# Patient Record
Sex: Female | Born: 1975 | Race: Black or African American | Hispanic: No | Marital: Married
Health system: Southern US, Community
[De-identification: ages and names within clinical notes are randomized; demographics above are authoritative.]

## PROBLEM LIST (undated history)

## (undated) DIAGNOSIS — I10 Essential (primary) hypertension: Secondary | ICD-10-CM

## (undated) DIAGNOSIS — F1910 Other psychoactive substance abuse, uncomplicated: Secondary | ICD-10-CM

## (undated) HISTORY — PX: NO PAST SURGERIES: SHX2092

---

## 2000-01-19 ENCOUNTER — Emergency Department (HOSPITAL_COMMUNITY): Admission: EM | Admit: 2000-01-19 | Discharge: 2000-01-19 | Payer: Self-pay | Admitting: Emergency Medicine

## 2002-07-27 ENCOUNTER — Ambulatory Visit (HOSPITAL_COMMUNITY): Admission: RE | Admit: 2002-07-27 | Discharge: 2002-07-27 | Payer: Self-pay | Admitting: *Deleted

## 2002-11-20 ENCOUNTER — Ambulatory Visit (HOSPITAL_COMMUNITY): Admission: RE | Admit: 2002-11-20 | Discharge: 2002-11-20 | Payer: Self-pay | Admitting: *Deleted

## 2002-11-24 ENCOUNTER — Inpatient Hospital Stay (HOSPITAL_COMMUNITY): Admission: AD | Admit: 2002-11-24 | Discharge: 2002-11-25 | Payer: Self-pay | Admitting: *Deleted

## 2002-12-19 ENCOUNTER — Inpatient Hospital Stay (HOSPITAL_COMMUNITY): Admission: AD | Admit: 2002-12-19 | Discharge: 2002-12-22 | Payer: Self-pay | Admitting: *Deleted

## 2003-12-11 ENCOUNTER — Ambulatory Visit (HOSPITAL_COMMUNITY): Admission: RE | Admit: 2003-12-11 | Discharge: 2003-12-11 | Payer: Self-pay | Admitting: *Deleted

## 2004-02-26 ENCOUNTER — Inpatient Hospital Stay (HOSPITAL_COMMUNITY): Admission: AD | Admit: 2004-02-26 | Discharge: 2004-02-28 | Payer: Self-pay | Admitting: Obstetrics and Gynecology

## 2004-02-26 ENCOUNTER — Ambulatory Visit: Payer: Self-pay | Admitting: Obstetrics and Gynecology

## 2006-01-18 ENCOUNTER — Ambulatory Visit (HOSPITAL_COMMUNITY): Admission: RE | Admit: 2006-01-18 | Discharge: 2006-01-18 | Payer: Self-pay | Admitting: Obstetrics

## 2006-03-22 ENCOUNTER — Inpatient Hospital Stay (HOSPITAL_COMMUNITY): Admission: AD | Admit: 2006-03-22 | Discharge: 2006-03-25 | Payer: Self-pay | Admitting: Obstetrics

## 2009-10-17 ENCOUNTER — Inpatient Hospital Stay (HOSPITAL_COMMUNITY): Admission: AD | Admit: 2009-10-17 | Discharge: 2009-10-17 | Payer: Self-pay | Admitting: Obstetrics & Gynecology

## 2009-10-17 ENCOUNTER — Ambulatory Visit: Payer: Self-pay | Admitting: Physician Assistant

## 2010-03-01 NOTE — L&D Delivery Note (Addendum)
   Delivery Note At 11:38 PM a viable female was delivered via  (Presentation: ;  ).  APGAR: 8, 9; weight 7 lb 15 oz (3600 g).   Placenta status: Intact, Manual removal.  Cord: 3 vessels with the following complications: None.  Anesthesia: Epidural  Episiotomy: None Lacerations: None Suture Repair: no repair Est. Blood Loss (mL):   Mom to postpartum.  Baby to nursery-stable.  Rhian Funari 09/09/2010, 12:30 AM     Requested by Dr. Penne Lash to attend this vaginal delivery for thick MSAF.  Born to a 79 y/o G5P4 mother with PNC O+Ab- and negative screens.  Maternal history of (+) smoker and  cocaine use.   SROM >24 hours PTD with thick MSAF.  Infant less than a minute old when team arrived and crying vigorously under radiant warmer.  Dried, bulb suctioned and kept warm.  APGAR 8 and 9.  Per RN, infant is up for adoption.   Perlie Gold, MD Neonatologist

## 2010-05-14 LAB — GC/CHLAMYDIA PROBE AMP, GENITAL
Chlamydia, DNA Probe: POSITIVE — AB
GC Probe Amp, Genital: NEGATIVE

## 2010-05-14 LAB — RAPID URINE DRUG SCREEN, HOSP PERFORMED
Amphetamines: NOT DETECTED
Barbiturates: NOT DETECTED
Benzodiazepines: NOT DETECTED
Cocaine: NOT DETECTED
Opiates: NOT DETECTED
Tetrahydrocannabinol: NOT DETECTED

## 2010-05-14 LAB — URINE MICROSCOPIC-ADD ON

## 2010-05-14 LAB — CBC
HCT: 38.2 % (ref 36.0–46.0)
Hemoglobin: 12.1 g/dL (ref 12.0–15.0)
MCH: 29.6 pg (ref 26.0–34.0)
MCHC: 31.6 g/dL (ref 30.0–36.0)
MCV: 93.6 fL (ref 78.0–100.0)
Platelets: 248 10*3/uL (ref 150–400)
RBC: 4.08 MIL/uL (ref 3.87–5.11)
RDW: 13.8 % (ref 11.5–15.5)
WBC: 6.4 10*3/uL (ref 4.0–10.5)

## 2010-05-14 LAB — URINALYSIS, ROUTINE W REFLEX MICROSCOPIC
Bilirubin Urine: NEGATIVE
Glucose, UA: NEGATIVE mg/dL
Ketones, ur: NEGATIVE mg/dL
Leukocytes, UA: NEGATIVE
Nitrite: NEGATIVE
Protein, ur: NEGATIVE mg/dL
Specific Gravity, Urine: 1.01 (ref 1.005–1.030)
Urobilinogen, UA: 0.2 mg/dL (ref 0.0–1.0)
pH: 7 (ref 5.0–8.0)

## 2010-05-14 LAB — HCG, QUANTITATIVE, PREGNANCY: hCG, Beta Chain, Quant, S: 2436 m[IU]/mL — ABNORMAL HIGH (ref <5)

## 2010-05-14 LAB — WET PREP, GENITAL
Trich, Wet Prep: NONE SEEN
Yeast Wet Prep HPF POC: NONE SEEN

## 2010-05-14 LAB — ABO/RH: ABO/RH(D): A POS

## 2010-05-14 LAB — POCT PREGNANCY, URINE: Preg Test, Ur: POSITIVE

## 2010-07-08 ENCOUNTER — Inpatient Hospital Stay (HOSPITAL_COMMUNITY): Payer: Medicaid Other

## 2010-07-08 ENCOUNTER — Inpatient Hospital Stay (HOSPITAL_COMMUNITY)
Admission: AD | Admit: 2010-07-08 | Discharge: 2010-07-08 | Disposition: A | Discharge status: Home / Self Care | Payer: Medicaid Other | Source: Ambulatory Visit | Attending: Obstetrics and Gynecology | Admitting: Obstetrics and Gynecology

## 2010-07-08 DIAGNOSIS — O139 Gestational [pregnancy-induced] hypertension without significant proteinuria, unspecified trimester: Secondary | ICD-10-CM

## 2010-07-08 LAB — CBC
HCT: 32.5 % — ABNORMAL LOW (ref 36.0–46.0)
Hemoglobin: 10.6 g/dL — ABNORMAL LOW (ref 12.0–15.0)
MCH: 30.3 pg (ref 26.0–34.0)
MCHC: 32.6 g/dL (ref 30.0–36.0)
MCV: 92.9 fL (ref 78.0–100.0)
Platelets: 220 10*3/uL (ref 150–400)
RBC: 3.5 MIL/uL — ABNORMAL LOW (ref 3.87–5.11)
RDW: 13.1 % (ref 11.5–15.5)
WBC: 8.2 10*3/uL (ref 4.0–10.5)

## 2010-07-08 LAB — DIFFERENTIAL
Basophils Absolute: 0 10*3/uL (ref 0.0–0.1)
Basophils Relative: 0 % (ref 0–1)
Eosinophils Absolute: 0 10*3/uL (ref 0.0–0.7)
Eosinophils Relative: 0 % (ref 0–5)
Lymphocytes Relative: 20 % (ref 12–46)
Lymphs Abs: 1.6 10*3/uL (ref 0.7–4.0)
Monocytes Absolute: 0.4 10*3/uL (ref 0.1–1.0)
Monocytes Relative: 5 % (ref 3–12)
Neutro Abs: 6.1 10*3/uL (ref 1.7–7.7)
Neutrophils Relative %: 75 % (ref 43–77)

## 2010-07-08 LAB — COMPREHENSIVE METABOLIC PANEL
ALT: 6 U/L (ref 0–35)
AST: 9 U/L (ref 0–37)
Albumin: 2.1 g/dL — ABNORMAL LOW (ref 3.5–5.2)
Alkaline Phosphatase: 59 U/L (ref 39–117)
BUN: 6 mg/dL (ref 6–23)
CO2: 21 mEq/L (ref 19–32)
Calcium: 8.3 mg/dL — ABNORMAL LOW (ref 8.4–10.5)
Chloride: 103 mEq/L (ref 96–112)
Creatinine, Ser: 0.53 mg/dL (ref 0.4–1.2)
GFR calc Af Amer: >60 mL/min (ref >60)
GFR calc non Af Amer: >60 mL/min (ref >60)
Glucose, Bld: 83 mg/dL (ref 70–99)
Potassium: 3.4 mEq/L — ABNORMAL LOW (ref 3.5–5.1)
Sodium: 132 mEq/L — ABNORMAL LOW (ref 135–145)
Total Bilirubin: 0.1 mg/dL — ABNORMAL LOW (ref 0.3–1.2)
Total Protein: 6 g/dL (ref 6.0–8.3)

## 2010-07-08 LAB — URINALYSIS, ROUTINE W REFLEX MICROSCOPIC
Bilirubin Urine: NEGATIVE
Glucose, UA: NEGATIVE mg/dL
Hgb urine dipstick: NEGATIVE
Ketones, ur: NEGATIVE mg/dL
Nitrite: NEGATIVE
Protein, ur: NEGATIVE mg/dL
Specific Gravity, Urine: 1.025 (ref 1.005–1.030)
Urobilinogen, UA: 0.2 mg/dL (ref 0.0–1.0)
pH: 6 (ref 5.0–8.0)

## 2010-07-08 LAB — TYPE AND SCREEN
ABO/RH(D): A POS
Antibody Screen: NEGATIVE

## 2010-07-08 LAB — POCT PREGNANCY, URINE: Preg Test, Ur: POSITIVE

## 2010-07-08 LAB — RAPID URINE DRUG SCREEN, HOSP PERFORMED
Amphetamines: NOT DETECTED
Barbiturates: NOT DETECTED
Benzodiazepines: NOT DETECTED
Cocaine: POSITIVE — AB
Opiates: NOT DETECTED
Tetrahydrocannabinol: NOT DETECTED

## 2010-07-08 LAB — URINE MICROSCOPIC-ADD ON

## 2010-07-08 LAB — WET PREP, GENITAL
Clue Cells Wet Prep HPF POC: NONE SEEN
Trich, Wet Prep: NONE SEEN
Yeast Wet Prep HPF POC: NONE SEEN

## 2010-07-08 LAB — SICKLE CELL SCREEN: Sickle Cell Screen: NEGATIVE

## 2010-07-08 LAB — RPR: RPR Ser Ql: NONREACTIVE

## 2010-07-09 LAB — GC/CHLAMYDIA PROBE AMP, GENITAL: Chlamydia, DNA Probe: POSITIVE — AB

## 2010-07-09 LAB — HIV ANTIBODY (ROUTINE TESTING W REFLEX): HIV: NONREACTIVE

## 2010-07-09 LAB — HEPATITIS B SURFACE ANTIGEN: Hepatitis B Surface Ag: NEGATIVE

## 2010-09-08 ENCOUNTER — Encounter (HOSPITAL_COMMUNITY): Payer: Self-pay | Admitting: *Deleted

## 2010-09-08 ENCOUNTER — Inpatient Hospital Stay (HOSPITAL_COMMUNITY)
Admission: AD | Admit: 2010-09-08 | Discharge: 2010-09-10 | DRG: 774 | Disposition: A | Discharge status: Home / Self Care | Payer: Medicaid Other | Source: Ambulatory Visit | Attending: Obstetrics & Gynecology | Admitting: Obstetrics & Gynecology

## 2010-09-08 ENCOUNTER — Inpatient Hospital Stay (HOSPITAL_COMMUNITY): Payer: Medicaid Other | Admitting: Anesthesiology

## 2010-09-08 ENCOUNTER — Encounter (HOSPITAL_COMMUNITY): Payer: Self-pay | Admitting: Anesthesiology

## 2010-09-08 DIAGNOSIS — IMO0001 Reserved for inherently not codable concepts without codable children: Secondary | ICD-10-CM

## 2010-09-08 DIAGNOSIS — O99344 Other mental disorders complicating childbirth: Secondary | ICD-10-CM

## 2010-09-08 DIAGNOSIS — F1410 Cocaine abuse, uncomplicated: Secondary | ICD-10-CM | POA: Diagnosis present

## 2010-09-08 HISTORY — DX: Essential (primary) hypertension: I10

## 2010-09-08 LAB — RAPID URINE DRUG SCREEN, HOSP PERFORMED
Amphetamines: NOT DETECTED
Opiates: NOT DETECTED
Tetrahydrocannabinol: NOT DETECTED

## 2010-09-08 LAB — CBC
MCH: 27.9 pg (ref 26.0–34.0)
MCV: 85.2 fL (ref 78.0–100.0)
Platelets: 229 10*3/uL (ref 150–400)
RDW: 14.8 % (ref 11.5–15.5)

## 2010-09-08 MED ORDER — DIPHENHYDRAMINE HCL 50 MG/ML IJ SOLN: 12.5000 mg | INTRAMUSCULAR | Status: DC | PRN

## 2010-09-08 MED ORDER — OXYTOCIN 20 UNITS IN LACTATED RINGERS INFUSION - SIMPLE
125.0000 mL/h | Freq: Once | INTRAVENOUS | Status: AC
  Administered 2010-09-08: 500 mL/h via INTRAVENOUS
  Filled 2010-09-08: qty 1000

## 2010-09-08 MED ORDER — ONDANSETRON HCL 4 MG/2ML IJ SOLN: 4.0000 mg | Freq: Four times a day (QID) | INTRAMUSCULAR | Status: DC | PRN

## 2010-09-08 MED ORDER — BUPIVACAINE HCL (PF) 0.25 % IJ SOLN
INTRAMUSCULAR | Status: DC | PRN
  Administered 2010-09-08: 10 mg

## 2010-09-08 MED ORDER — LACTATED RINGERS IV SOLN
500.0000 mL | Freq: Once | INTRAVENOUS | Status: AC
  Administered 2010-09-08: 1000 mL via INTRAVENOUS

## 2010-09-08 MED ORDER — LACTATED RINGERS IV SOLN: 500.0000 mL | INTRAVENOUS | Status: DC | PRN

## 2010-09-08 MED ORDER — FLEET ENEMA 7-19 GM/118ML RE ENEM: 1.0000 | ENEMA | RECTAL | Status: DC | PRN

## 2010-09-08 MED ORDER — ACETAMINOPHEN 325 MG PO TABS: 650.0000 mg | ORAL_TABLET | ORAL | Status: DC | PRN

## 2010-09-08 MED ORDER — LIDOCAINE HCL (PF) 2 % IJ SOLN
INTRAMUSCULAR | Status: DC | PRN
  Administered 2010-09-08 (×2): 40 mg
  Administered 2010-09-08: 80 mg
  Administered 2010-09-08: 40 mg

## 2010-09-08 MED ORDER — IBUPROFEN 600 MG PO TABS: 600.0000 mg | ORAL_TABLET | Freq: Four times a day (QID) | ORAL | Status: DC | PRN

## 2010-09-08 MED ORDER — EPHEDRINE 5 MG/ML INJ: 10.0000 mg | INTRAVENOUS | Status: DC | PRN

## 2010-09-08 MED ORDER — ERYTHROMYCIN 5 MG/GM OP OINT
TOPICAL_OINTMENT | OPHTHALMIC | Status: AC
  Filled 2010-09-08: qty 1

## 2010-09-08 MED ORDER — CITRIC ACID-SODIUM CITRATE 334-500 MG/5ML PO SOLN: 30.0000 mL | ORAL | Status: DC | PRN

## 2010-09-08 MED ORDER — LACTATED RINGERS IV SOLN
INTRAVENOUS | Status: DC
  Administered 2010-09-08 (×2): via INTRAVENOUS

## 2010-09-08 MED ORDER — LIDOCAINE HCL (PF) 1 % IJ SOLN
30.0000 mL | Freq: Once | INTRAMUSCULAR | Status: AC | PRN
  Filled 2010-09-08: qty 30

## 2010-09-08 MED ORDER — PHENYLEPHRINE 40 MCG/ML (10ML) SYRINGE FOR IV PUSH (FOR BLOOD PRESSURE SUPPORT)
80.0000 ug | PREFILLED_SYRINGE | INTRAVENOUS | Status: DC | PRN
  Filled 2010-09-08: qty 5

## 2010-09-08 MED ORDER — PHENYLEPHRINE 40 MCG/ML (10ML) SYRINGE FOR IV PUSH (FOR BLOOD PRESSURE SUPPORT): 80.0000 ug | PREFILLED_SYRINGE | INTRAVENOUS | Status: DC | PRN

## 2010-09-08 MED ORDER — EPHEDRINE 5 MG/ML INJ
10.0000 mg | INTRAVENOUS | Status: DC | PRN
  Filled 2010-09-08: qty 4

## 2010-09-08 MED ORDER — FENTANYL 2.5 MCG/ML BUPIVACAINE 1/10 % EPIDURAL INFUSION (WH - ANES)
2.0000 mL/h | INTRAMUSCULAR | Status: DC
  Administered 2010-09-08: 14 mL/h via EPIDURAL
  Filled 2010-09-08: qty 60

## 2010-09-08 NOTE — Anesthesia Procedure Notes (Addendum)
Epidural Patient location during procedure: OB Start time: 09/08/2010 9:21 PM  Staffing Anesthesiologist: Jiles Garter  Preanesthetic Checklist Completed: patient identified, site marked, surgical consent, pre-op evaluation, timeout performed, IV checked, risks and benefits discussed and monitors and equipment checked  Epidural Patient position: sitting Prep: DuraPrep Patient monitoring: continuous pulse ox and blood pressure Approach: midline Injection technique: LOR air  Needle Needle type: Tuohy  Needle gauge: 17 G Needle length: 9 cm Catheter type: closed end flexible Catheter size: 19 Gauge Test dose: negative  Assessment Events: blood not aspirated, injection not painful, no injection resistance, negative IV test and no paresthesia  Additional Notes Discussed epidural, and patient consents to the procedure:  included risk of possible headache,backache, failed block, allergic reaction, and nerve injury. This patient was asked if she had any questions or concerns before the procedure started. See nursing notes for post-procedure vital signs.  Patient feels better.

## 2010-09-08 NOTE — Anesthesia Preprocedure Evaluation (Signed)
Anesthesia Evaluation  Name, MR# and DOB Patient awake  General Assessment Comment  Reviewed: Allergy & Precautions, H&P  and Patient's Chart, lab work & pertinent test results  Airway Mallampati: II TM Distance: >3 FB Neck ROM: full    Dental  (+) Teeth Intact   Pulmonary  clear to auscultation    Cardiovascular hypertension (no meds), regular Normal   Neuro/Psych  GI/Hepatic/Renal   Endo/Other   Abdominal   Musculoskeletal  Hematology   Peds  Reproductive/Obstetrics (+) Pregnancy   Anesthesia Other Findings Discussed epidural, and patient consents to the procedure:  included risk of possible headache,backache, failed block, allergic reaction, and nerve injury. This patient was asked if she had any questions or concerns before the procedure started.                Anesthesia Physical Anesthesia Plan  ASA: II  Anesthesia Plan: Epidural   Post-op Pain Management:    Induction:   Airway Management Planned:   Additional Equipment:   Intra-op Plan:   Post-operative Plan:   Informed Consent:   Plan Discussed with:   Anesthesia Plan Comments:         Anesthesia Quick Evaluation

## 2010-09-08 NOTE — Progress Notes (Signed)
Yesterday a brief gush of fluid accompanied by bleeding that has continued, fluid has not.  Pain that has progressively gotten worse, brought in by EMS today

## 2010-09-08 NOTE — H&P (Signed)
Norma Little is a 35 y.o. female presenting for SROm 1000 09/07/10, and contractions starting a few hours ago. Maternal Medical History:  Reason for admission: Reason for admission: rupture of membranes and contractions.  Contractions: Onset was 3-5 hours ago.   Frequency: regular.   Duration is approximately 1 minute.   Perceived severity is moderate.    Fetal activity: Perceived fetal activity is normal.   Last perceived fetal movement was within the past hour.    Prenatal complications: Bleeding and substance abuse.     OB History    Grav Para Term Preterm Abortions TAB SAB Ect Mult Living   5 4 4       4      Past Medical History  Diagnosis Date  . Hypertension    Past Surgical History  Procedure Date  . No past surgeries    Family History: family history is not on file. Social History:  reports that she has been smoking.  She does not have any smokeless tobacco history on file. She reports that she does not drink alcohol or use illicit drugs.  Review of Systems  All other systems reviewed and are negative.    Dilation: 4 Effacement (%): 80 Station: -2 Exam by:: Drenda Freeze Cresenzo-Dishmon CNM Blood pressure 133/67, pulse 101, temperature 99 F (37.2 C), temperature source Oral, resp. rate 20, height 5\' 8"  (1.727 m), weight 90.719 kg (200 lb), SpO2 98.00%. Maternal Exam:  Uterine Assessment: Contraction strength is moderate.  Contraction frequency is regular.   Abdomen: Fetal presentation: vertex  Introitus: Normal vulva. Normal vagina.  Ferning test: positive.  Nitrazine test: not done. Amniotic fluid character: bloody and clear.  Pelvis: adequate for delivery.   Cervix: Cervix evaluated by digital exam.     Physical Exam  Constitutional: She is oriented to person, place, and time. She appears well-developed and well-nourished. She appears distressed.  HENT:  Head: Normocephalic.  Eyes: Pupils are equal, round, and reactive to light.  Cardiovascular:  Normal rate and regular rhythm.   Respiratory: Breath sounds normal.  GI: Soft. There is tenderness.  Genitourinary: Vagina normal and uterus normal.  Musculoskeletal: Normal range of motion.  Neurological: She is alert and oriented to person, place, and time.  Skin: Skin is warm and dry.    Prenatal labs: ABO, Rh: A POS (05/09 1548) Antibody: NEG (05/09 1548) Rubella:immune   RPR: NON REACTIVE (05/09 1548)  HBsAg: NEGATIVE (05/09 1548)  HIV: NON REACTIVE (05/09 1548)  GBS:   unknown  Assessment/Plan: Admit to L&D   CRESENZO-DISHMAN,Daxon Kyne 09/08/2010, 8:27 PM

## 2010-09-09 ENCOUNTER — Other Ambulatory Visit: Payer: Self-pay | Admitting: Obstetrics & Gynecology

## 2010-09-09 LAB — RPR: RPR Ser Ql: NONREACTIVE

## 2010-09-09 MED ORDER — ZOLPIDEM TARTRATE 5 MG PO TABS: 5.0000 mg | ORAL_TABLET | Freq: Every evening | ORAL | Status: DC | PRN

## 2010-09-09 MED ORDER — DEXTROSE 5 % IV SOLN
2.0000 g | Freq: Once | INTRAVENOUS | Status: AC
  Administered 2010-09-09: 2 g via INTRAVENOUS
  Filled 2010-09-09: qty 2

## 2010-09-09 MED ORDER — SODIUM CHLORIDE 0.9 % IJ SOLN: 3.0000 mL | Freq: Two times a day (BID) | INTRAMUSCULAR | Status: DC

## 2010-09-09 MED ORDER — OXYTOCIN 10 UNIT/ML IJ SOLN
INTRAMUSCULAR | Status: AC
  Filled 2010-09-09: qty 2

## 2010-09-09 MED ORDER — DIPHENHYDRAMINE HCL 25 MG PO CAPS: 25.0000 mg | ORAL_CAPSULE | Freq: Four times a day (QID) | ORAL | Status: DC | PRN

## 2010-09-09 MED ORDER — TETANUS-DIPHTH-ACELL PERTUSSIS 5-2.5-18.5 LF-MCG/0.5 IM SUSP
0.5000 mL | Freq: Once | INTRAMUSCULAR | Status: AC
  Administered 2010-09-09: 0.5 mL via INTRAMUSCULAR
  Filled 2010-09-09: qty 0.5

## 2010-09-09 MED ORDER — SENNOSIDES-DOCUSATE SODIUM 8.6-50 MG PO TABS
1.0000 | ORAL_TABLET | Freq: Every day | ORAL | Status: DC
  Administered 2010-09-09: 1 via ORAL

## 2010-09-09 MED ORDER — ONDANSETRON HCL 4 MG PO TABS: 4.0000 mg | ORAL_TABLET | ORAL | Status: DC | PRN

## 2010-09-09 MED ORDER — RHO D IMMUNE GLOBULIN 1500 UNIT/2ML IJ SOLN
300.0000 ug | Freq: Once | INTRAMUSCULAR | Status: DC
Start: 2010-09-09 — End: 2010-09-09

## 2010-09-09 MED ORDER — OXYCODONE-ACETAMINOPHEN 5-325 MG PO TABS
1.0000 | ORAL_TABLET | ORAL | Status: DC | PRN
  Administered 2010-09-09: 2 via ORAL
  Administered 2010-09-09 – 2010-09-10 (×3): 1 via ORAL
  Filled 2010-09-09 (×3): qty 1

## 2010-09-09 MED ORDER — IBUPROFEN 600 MG PO TABS
600.0000 mg | ORAL_TABLET | Freq: Four times a day (QID) | ORAL | Status: DC
  Administered 2010-09-09 – 2010-09-10 (×6): 600 mg via ORAL
  Filled 2010-09-09 (×6): qty 1

## 2010-09-09 MED ORDER — LANOLIN HYDROUS EX OINT: TOPICAL_OINTMENT | CUTANEOUS | Status: DC | PRN

## 2010-09-09 MED ORDER — SODIUM CHLORIDE 0.9 % IV SOLN: 250.0000 mL | INTRAVENOUS | Status: DC

## 2010-09-09 MED ORDER — ONDANSETRON HCL 4 MG/2ML IJ SOLN: 4.0000 mg | INTRAMUSCULAR | Status: DC | PRN

## 2010-09-09 MED ORDER — OXYCODONE-ACETAMINOPHEN 5-325 MG PO TABS
ORAL_TABLET | ORAL | Status: AC
  Filled 2010-09-09: qty 2

## 2010-09-09 MED ORDER — WITCH HAZEL-GLYCERIN EX PADS: MEDICATED_PAD | CUTANEOUS | Status: DC | PRN

## 2010-09-09 MED ORDER — SIMETHICONE 80 MG PO CHEW: 80.0000 mg | CHEWABLE_TABLET | ORAL | Status: DC | PRN

## 2010-09-09 MED ORDER — RHO D IMMUNE GLOBULIN 1500 UNIT/2ML IJ SOLN: 300.0000 ug | Freq: Once | INTRAMUSCULAR | Status: DC

## 2010-09-09 MED ORDER — BENZOCAINE-MENTHOL 20-0.5 % EX AERO: 1.0000 "application " | INHALATION_SPRAY | CUTANEOUS | Status: DC | PRN

## 2010-09-09 MED ORDER — SODIUM CHLORIDE 0.9 % IJ SOLN: 3.0000 mL | INTRAMUSCULAR | Status: DC | PRN

## 2010-09-09 MED ORDER — PRENATAL PLUS 27-1 MG PO TABS
1.0000 | ORAL_TABLET | Freq: Every day | ORAL | Status: DC
  Administered 2010-09-09 – 2010-09-10 (×2): 1 via ORAL
  Filled 2010-09-09 (×2): qty 1

## 2010-09-09 NOTE — Progress Notes (Cosign Needed)
UR chart review completed.  

## 2010-09-09 NOTE — Progress Notes (Signed)
Post Partum Day 1 Subjective: no complaints, up ad lib, voiding, tolerating PO and + flatus Unsure if she will be taking baby home or turning directly over to adoption agency Objective: Blood pressure 146/81, pulse 103, temperature 98.3 F (36.8 C), temperature source Oral, resp. rate 18, height 5\' 8"  (1.727 m), weight 90.719 kg (200 lb), SpO2 97.00%.  Physical Exam:  General: alert and cooperative Lochia: appropriate Uterine Fundus: firm Incision:  DVT Evaluation: No evidence of DVT seen on physical exam. Results for orders placed during the hospital encounter of 09/08/10 (from the past 24 hour(s))  DRUG SCREEN PANEL, EMERGENCY     Status: Abnormal   Collection Time   09/08/10  8:05 PM      Component Value Range   Opiates NONE DETECTED  NONE DETECTED    Cocaine POSITIVE (*) NONE DETECTED    Benzodiazepines NONE DETECTED  NONE DETECTED    Amphetamines NONE DETECTED  NONE DETECTED    Tetrahydrocannabinol NONE DETECTED  NONE DETECTED    Barbiturates NONE DETECTED  NONE DETECTED   CBC     Status: Abnormal   Collection Time   09/08/10  8:25 PM      Component Value Range   WBC 12.1 (*) 4.0 - 10.5 (K/uL)   RBC 3.84 (*) 3.87 - 5.11 (MIL/uL)   Hemoglobin 10.7 (*) 12.0 - 15.0 (g/dL)   HCT 04.5 (*) 40.9 - 46.0 (%)   MCV 85.2  78.0 - 100.0 (fL)   MCH 27.9  26.0 - 34.0 (pg)   MCHC 32.7  30.0 - 36.0 (g/dL)   RDW 81.1  91.4 - 78.2 (%)   Platelets 229  150 - 400 (K/uL)  RPR     Status: Normal   Collection Time   09/08/10  8:25 PM      Component Value Range   RPR NON REACTIVE  NON REACTIVE   RAPID HIV SCREEN ([facility])     Status: Normal   Collection Time   09/08/10  8:25 PM      Component Value Range   SUDS Rapid HIV Screen NON REACTIVE  NON REACTIVE    Pt denies recent cocaine use.    Basename 09/08/10 2025  HGB 10.7*  HCT 32.7*    Assessment/Plan: Plan for discharge tomorrow SW consult in progress for BUFA and +cocaine   LOS: 1 day   CRESENZO-DISHMAN,Sagal Gayton 09/09/2010,  6:55 AM

## 2010-09-10 MED ORDER — IBUPROFEN 600 MG PO TABS: 600.0000 mg | ORAL_TABLET | Freq: Four times a day (QID) | ORAL | Status: AC

## 2010-09-10 NOTE — Progress Notes (Signed)
  Post Partum Day 2 Subjective: no complaints, up ad lib, voiding, tolerating PO and + flatus  Objective: Blood pressure 142/96, pulse 80, temperature 97.9 F (36.6 C), temperature source Oral, resp. rate 18, height 5\' 8"  (1.727 m), weight 200 lb (90.719 kg), SpO2 97.00%.  Physical Exam:  General: alert and no distress Lochia: appropriate Uterine Fundus: firm Incision: n/a DVT Evaluation: No significant calf/ankle edema.   Basename 09/08/10 2025  HGB 10.7*  HCT 32.7*    Assessment/Plan: Discharge home and Contraception IUD. Baby is BUFA. Mom was cocaine +.    LOS: 2 days   Norma Little 09/10/2010, 8:57 AM

## 2010-09-10 NOTE — Discharge Summary (Addendum)
  Obstetric Discharge Summary Reason for Admission: onset of labor Prenatal Procedures: none Intrapartum Procedures: spontaneous vaginal delivery Postpartum Procedures: none Complications-Operative and Postpartum: retained placenta, manual extraction, s/p mefoxin 2gx1  Hemoglobin  Date Value Range Status  09/08/2010 10.7* 12.0-15.0 (g/dL) Final     HCT  Date Value Range Status  09/08/2010 32.7* 36.0-46.0 (%) Final    Discharge Diagnoses: Term Pregnancy-delivered and cocaine abuse  Discharge Information: Date: 09/10/2010 Activity: unrestricted and pelvic rest Diet: routine Medications: Ibuprophen Condition: stable Instructions: refer to practice specific booklet Discharge to: home   Newborn Data: Live born  Information for the patient's newborn:  Dovie, [place] Girl Johnetta [[id number]]  female ; APGAR , ; weight ;  Home with BUFA, baby to stay in NBN while awaiting adoption.  Norma Little 09/10/2010, 8:58 AM

## 2010-09-11 LAB — CULTURE, BETA STREP (GROUP B ONLY): Special Requests: NORMAL

## 2010-09-15 NOTE — Anesthesia Postprocedure Evaluation (Signed)
  Anesthesia Post-op Note  Patient: Norma Little  Procedure(s) Performed: * No procedures listed *  Patient Location: PACU  Anesthesia Type: Regional  Level of Consciousness: awake  Airway and Oxygen Therapy: Patient Spontanous Breathing  Post-op Pain: mild  Post-op Assessment: Post-op Vital signs reviewed  Post-op Vital Signs: Reviewed  Complications: No apparent anesthesia complications

## 2010-09-22 ENCOUNTER — Telehealth (HOSPITAL_COMMUNITY): Payer: Self-pay | Admitting: *Deleted

## 2010-09-22 NOTE — Telephone Encounter (Signed)
Brandy with [facility] STD clinic called regarding treatment info for Ms. Allcorn.  Ms. Folk has had a positive chlamydia culture on 07/09/10 and 09/09/10.  Patient did not follow up for her treatment in May and her July culture did not result until after discharge.  The health department is attempting to get patient in for treatment, but have had trouble with her in the past.  If Ms. Opperman presents back to [facility] for any reason, she will need to be notified and treated for her chlamydia.

## 2010-12-15 ENCOUNTER — Encounter (HOSPITAL_COMMUNITY): Payer: Self-pay | Admitting: *Deleted

## 2013-12-31 ENCOUNTER — Encounter (HOSPITAL_COMMUNITY): Payer: Self-pay | Admitting: *Deleted

## 2014-10-05 ENCOUNTER — Emergency Department (HOSPITAL_COMMUNITY): Payer: Medicaid Other

## 2014-10-05 ENCOUNTER — Other Ambulatory Visit: Payer: Self-pay

## 2014-10-05 ENCOUNTER — Inpatient Hospital Stay (HOSPITAL_COMMUNITY)
Admission: EM | Admit: 2014-10-05 | Discharge: 2014-10-31 | DRG: 064 | Disposition: E | Payer: Medicaid Other | Attending: Neurology | Admitting: Neurology

## 2014-10-05 ENCOUNTER — Encounter (HOSPITAL_COMMUNITY): Payer: Self-pay | Admitting: Adult Health

## 2014-10-05 DIAGNOSIS — I613 Nontraumatic intracerebral hemorrhage in brain stem: Secondary | ICD-10-CM | POA: Diagnosis present

## 2014-10-05 DIAGNOSIS — E876 Hypokalemia: Secondary | ICD-10-CM | POA: Diagnosis present

## 2014-10-05 DIAGNOSIS — D72829 Elevated white blood cell count, unspecified: Secondary | ICD-10-CM | POA: Diagnosis present

## 2014-10-05 DIAGNOSIS — Z66 Do not resuscitate: Secondary | ICD-10-CM | POA: Diagnosis not present

## 2014-10-05 DIAGNOSIS — F1410 Cocaine abuse, uncomplicated: Secondary | ICD-10-CM | POA: Diagnosis present

## 2014-10-05 DIAGNOSIS — I129 Hypertensive chronic kidney disease with stage 1 through stage 4 chronic kidney disease, or unspecified chronic kidney disease: Secondary | ICD-10-CM | POA: Diagnosis present

## 2014-10-05 DIAGNOSIS — G932 Benign intracranial hypertension: Secondary | ICD-10-CM | POA: Diagnosis present

## 2014-10-05 DIAGNOSIS — Z880 Allergy status to penicillin: Secondary | ICD-10-CM

## 2014-10-05 DIAGNOSIS — S06369D Traumatic hemorrhage of cerebrum, unspecified, with loss of consciousness of unspecified duration, subsequent encounter: Secondary | ICD-10-CM

## 2014-10-05 DIAGNOSIS — Z515 Encounter for palliative care: Secondary | ICD-10-CM

## 2014-10-05 DIAGNOSIS — I619 Nontraumatic intracerebral hemorrhage, unspecified: Secondary | ICD-10-CM | POA: Diagnosis present

## 2014-10-05 DIAGNOSIS — R40243 Glasgow coma scale score 3-8: Secondary | ICD-10-CM | POA: Diagnosis present

## 2014-10-05 DIAGNOSIS — E872 Acidosis: Secondary | ICD-10-CM | POA: Diagnosis present

## 2014-10-05 DIAGNOSIS — R000 Tachycardia, unspecified: Secondary | ICD-10-CM | POA: Diagnosis present

## 2014-10-05 DIAGNOSIS — J9600 Acute respiratory failure, unspecified whether with hypoxia or hypercapnia: Secondary | ICD-10-CM | POA: Diagnosis present

## 2014-10-05 DIAGNOSIS — N179 Acute kidney failure, unspecified: Secondary | ICD-10-CM | POA: Diagnosis present

## 2014-10-05 DIAGNOSIS — N189 Chronic kidney disease, unspecified: Secondary | ICD-10-CM | POA: Diagnosis present

## 2014-10-05 DIAGNOSIS — R739 Hyperglycemia, unspecified: Secondary | ICD-10-CM | POA: Diagnosis present

## 2014-10-05 DIAGNOSIS — G935 Compression of brain: Secondary | ICD-10-CM | POA: Diagnosis present

## 2014-10-05 DIAGNOSIS — J9601 Acute respiratory failure with hypoxia: Secondary | ICD-10-CM

## 2014-10-05 DIAGNOSIS — R578 Other shock: Secondary | ICD-10-CM | POA: Diagnosis present

## 2014-10-05 DIAGNOSIS — R4182 Altered mental status, unspecified: Secondary | ICD-10-CM | POA: Diagnosis present

## 2014-10-05 HISTORY — DX: Other psychoactive substance abuse, uncomplicated: F19.10

## 2014-10-05 HISTORY — DX: Essential (primary) hypertension: I10

## 2014-10-05 LAB — COMPREHENSIVE METABOLIC PANEL
ALK PHOS: 87 U/L (ref 38–126)
ALT: 40 U/L (ref 14–54)
AST: 77 U/L — ABNORMAL HIGH (ref 15–41)
Albumin: 3.3 g/dL — ABNORMAL LOW (ref 3.5–5.0)
Anion gap: 13 (ref 5–15)
BUN: 16 mg/dL (ref 6–20)
CALCIUM: 9.2 mg/dL (ref 8.9–10.3)
CO2: 24 mmol/L (ref 22–32)
Chloride: 99 mmol/L — ABNORMAL LOW (ref 101–111)
Creatinine, Ser: 1.45 mg/dL — ABNORMAL HIGH (ref 0.44–1.00)
GFR, EST AFRICAN AMERICAN: 51 mL/min — AB (ref >60)
GFR, EST NON AFRICAN AMERICAN: 44 mL/min — AB (ref >60)
GLUCOSE: 268 mg/dL — AB (ref 65–99)
Potassium: 3.3 mmol/L — ABNORMAL LOW (ref 3.5–5.1)
Sodium: 136 mmol/L (ref 135–145)
Total Bilirubin: 0.4 mg/dL (ref 0.3–1.2)
Total Protein: 7.6 g/dL (ref 6.5–8.1)

## 2014-10-05 LAB — I-STAT ARTERIAL BLOOD GAS, ED
Acid-base deficit: 4 mmol/L — ABNORMAL HIGH (ref 0.0–2.0)
Bicarbonate: 27.5 mEq/L — ABNORMAL HIGH (ref 20.0–24.0)
O2 Saturation: 97 %
PCO2 ART: 77.4 mmHg — AB (ref 35.0–45.0)
PH ART: 7.159 — AB (ref 7.350–7.450)
TCO2: 30 mmol/L (ref 0–100)
pO2, Arterial: 119 mmHg — ABNORMAL HIGH (ref 80.0–100.0)

## 2014-10-05 LAB — PROTIME-INR
INR: 1.19 (ref 0.00–1.49)
Prothrombin Time: 15.3 seconds — ABNORMAL HIGH (ref 11.6–15.2)

## 2014-10-05 LAB — CBC WITH DIFFERENTIAL/PLATELET
Basophils Absolute: 0 10*3/uL (ref 0.0–0.1)
Basophils Relative: 0 % (ref 0–1)
EOS ABS: 0.2 10*3/uL (ref 0.0–0.7)
EOS PCT: 1 % (ref 0–5)
HEMATOCRIT: 46.1 % — AB (ref 36.0–46.0)
Hemoglobin: 14.5 g/dL (ref 12.0–15.0)
LYMPHS PCT: 50 % — AB (ref 12–46)
Lymphs Abs: 8.9 10*3/uL — ABNORMAL HIGH (ref 0.7–4.0)
MCH: 27.3 pg (ref 26.0–34.0)
MCHC: 31.5 g/dL (ref 30.0–36.0)
MCV: 86.8 fL (ref 78.0–100.0)
Monocytes Absolute: 1.2 10*3/uL — ABNORMAL HIGH (ref 0.1–1.0)
Monocytes Relative: 7 % (ref 3–12)
NEUTROS PCT: 42 % — AB (ref 43–77)
Neutro Abs: 7.5 10*3/uL (ref 1.7–7.7)
Platelets: 311 10*3/uL (ref 150–400)
RBC: 5.31 MIL/uL — ABNORMAL HIGH (ref 3.87–5.11)
RDW: 16 % — AB (ref 11.5–15.5)
WBC: 17.8 10*3/uL — ABNORMAL HIGH (ref 4.0–10.5)

## 2014-10-05 LAB — I-STAT CHEM 8, ED
BUN: 24 mg/dL — ABNORMAL HIGH (ref 6–20)
CALCIUM ION: 1.11 mmol/L — AB (ref 1.12–1.23)
CHLORIDE: 103 mmol/L (ref 101–111)
Creatinine, Ser: 1.3 mg/dL — ABNORMAL HIGH (ref 0.44–1.00)
Glucose, Bld: 259 mg/dL — ABNORMAL HIGH (ref 65–99)
HCT: 53 % — ABNORMAL HIGH (ref 36.0–46.0)
Hemoglobin: 18 g/dL — ABNORMAL HIGH (ref 12.0–15.0)
POTASSIUM: 3.3 mmol/L — AB (ref 3.5–5.1)
Sodium: 138 mmol/L (ref 135–145)
TCO2: 21 mmol/L (ref 0–100)

## 2014-10-05 LAB — URINALYSIS, ROUTINE W REFLEX MICROSCOPIC
Bilirubin Urine: NEGATIVE
Glucose, UA: 250 mg/dL — AB
Ketones, ur: NEGATIVE mg/dL
Nitrite: NEGATIVE
PH: 6 (ref 5.0–8.0)
Protein, ur: >300 mg/dL — AB
Specific Gravity, Urine: 1.019 (ref 1.005–1.030)
Urobilinogen, UA: 1 mg/dL (ref 0.0–1.0)

## 2014-10-05 LAB — I-STAT BETA HCG BLOOD, ED (MC, WL, AP ONLY): I-stat hCG, quantitative: <5 m[IU]/mL (ref <5)

## 2014-10-05 LAB — RAPID URINE DRUG SCREEN, HOSP PERFORMED
Amphetamines: NOT DETECTED
BARBITURATES: NOT DETECTED
Benzodiazepines: NOT DETECTED
Cocaine: POSITIVE — AB
Opiates: NOT DETECTED
Tetrahydrocannabinol: NOT DETECTED

## 2014-10-05 LAB — I-STAT CG4 LACTIC ACID, ED: LACTIC ACID, VENOUS: 7.06 mmol/L — AB (ref 0.5–2.0)

## 2014-10-05 LAB — ETHANOL: Alcohol, Ethyl (B): <5 mg/dL (ref <5)

## 2014-10-05 LAB — I-STAT TROPONIN, ED: Troponin i, poc: 0.05 ng/mL (ref 0.00–0.08)

## 2014-10-05 LAB — URINE MICROSCOPIC-ADD ON

## 2014-10-05 LAB — GLUCOSE, CAPILLARY
GLUCOSE-CAPILLARY: 69 mg/dL (ref 65–99)
GLUCOSE-CAPILLARY: 110 mg/dL — AB (ref 65–99)
Glucose-Capillary: 200 mg/dL — ABNORMAL HIGH (ref 65–99)
Glucose-Capillary: 112 mg/dL — ABNORMAL HIGH (ref 65–99)

## 2014-10-05 LAB — MRSA PCR SCREENING: MRSA BY PCR: NEGATIVE

## 2014-10-05 MED ORDER — MORPHINE SULFATE 25 MG/ML IV SOLN
10.0000 mg/h | INTRAVENOUS | Status: DC
  Administered 2014-10-05: 10 mg/h via INTRAVENOUS
  Filled 2014-10-05: qty 10

## 2014-10-05 MED ORDER — SUCCINYLCHOLINE CHLORIDE 20 MG/ML IJ SOLN
INTRAMUSCULAR | Status: AC | PRN
  Administered 2014-10-05: 140 mg via INTRAVENOUS

## 2014-10-05 MED ORDER — LORAZEPAM 2 MG/ML IJ SOLN
2.0000 mg | Freq: Once | INTRAMUSCULAR | Status: AC
  Administered 2014-10-05: 2 mg via INTRAVENOUS
  Filled 2014-10-05: qty 1

## 2014-10-05 MED ORDER — PHENYLEPHRINE HCL 10 MG/ML IJ SOLN
30.0000 ug/min | INTRAVENOUS | Status: DC
  Administered 2014-10-05: 30 ug/min via INTRAVENOUS
  Filled 2014-10-05: qty 1

## 2014-10-05 MED ORDER — SODIUM CHLORIDE 0.9 % IV BOLUS (SEPSIS)
1000.0000 mL | Freq: Once | INTRAVENOUS | Status: AC
  Administered 2014-10-05: 1000 mL via INTRAVENOUS

## 2014-10-05 MED ORDER — PANTOPRAZOLE SODIUM 40 MG IV SOLR: 40.0000 mg | Freq: Every day | INTRAVENOUS | Status: DC

## 2014-10-05 MED ORDER — NICARDIPINE HCL IN NACL 20-0.86 MG/200ML-% IV SOLN
3.0000 mg/h | Freq: Once | INTRAVENOUS | Status: AC
  Administered 2014-10-05: 3 mg/h via INTRAVENOUS
  Filled 2014-10-05: qty 200

## 2014-10-05 MED ORDER — SENNOSIDES-DOCUSATE SODIUM 8.6-50 MG PO TABS
1.0000 | ORAL_TABLET | Freq: Two times a day (BID) | ORAL | Status: DC
  Filled 2014-10-05 (×2): qty 1

## 2014-10-05 MED ORDER — ACETAMINOPHEN 650 MG RE SUPP
650.0000 mg | Freq: Once | RECTAL | Status: AC
  Administered 2014-10-05: 650 mg via RECTAL
  Filled 2014-10-05: qty 1

## 2014-10-05 MED ORDER — MORPHINE BOLUS VIA INFUSION
5.0000 mg | INTRAVENOUS | Status: DC | PRN
  Filled 2014-10-05: qty 20

## 2014-10-05 MED ORDER — ACETAMINOPHEN 325 MG PO TABS: 650.0000 mg | ORAL_TABLET | ORAL | Status: DC | PRN

## 2014-10-05 MED ORDER — CHLORHEXIDINE GLUCONATE 0.12% ORAL RINSE (MEDLINE KIT)
15.0000 mL | Freq: Two times a day (BID) | OROMUCOSAL | Status: DC
  Administered 2014-10-05: 15 mL via OROMUCOSAL

## 2014-10-05 MED ORDER — INSULIN ASPART 100 UNIT/ML ~~LOC~~ SOLN
0.0000 [IU] | SUBCUTANEOUS | Status: DC
  Administered 2014-10-05: 3 [IU] via SUBCUTANEOUS

## 2014-10-05 MED ORDER — STROKE: EARLY STAGES OF RECOVERY BOOK
Freq: Once | Status: AC
  Administered 2014-10-05: 1
  Filled 2014-10-05: qty 1

## 2014-10-05 MED ORDER — LABETALOL HCL 5 MG/ML IV SOLN: 10.0000 mg | INTRAVENOUS | Status: DC | PRN

## 2014-10-05 MED ORDER — SODIUM CHLORIDE 0.9 % IV SOLN
INTRAVENOUS | Status: DC
  Administered 2014-10-05: 150 mL/h via INTRAVENOUS

## 2014-10-05 MED ORDER — ANTISEPTIC ORAL RINSE SOLUTION (CORINZ)
7.0000 mL | Freq: Four times a day (QID) | OROMUCOSAL | Status: DC
  Administered 2014-10-05: 7 mL via OROMUCOSAL

## 2014-10-05 MED ORDER — PHENYLEPHRINE HCL 10 MG/ML IJ SOLN
30.0000 ug/min | INTRAVENOUS | Status: DC
  Administered 2014-10-05: 30 ug/min via INTRAVENOUS
  Filled 2014-10-05 (×2): qty 1

## 2014-10-05 MED ORDER — ETOMIDATE 2 MG/ML IV SOLN
INTRAVENOUS | Status: AC | PRN
  Administered 2014-10-05: 15 mg via INTRAVENOUS

## 2014-10-05 MED ORDER — FENTANYL CITRATE (PF) 100 MCG/2ML IJ SOLN
100.0000 ug | Freq: Once | INTRAMUSCULAR | Status: AC
  Administered 2014-10-05: 100 ug via INTRAVENOUS
  Filled 2014-10-05: qty 2

## 2014-10-05 MED ORDER — ACETAMINOPHEN 650 MG RE SUPP
650.0000 mg | RECTAL | Status: DC | PRN
Start: 2014-10-05 — End: 2014-10-05

## 2014-10-05 MED ORDER — DEXTROSE 50 % IV SOLN
INTRAVENOUS | Status: AC
  Administered 2014-10-05: 25 mL via INTRAVENOUS
  Filled 2014-10-05: qty 50

## 2014-10-05 MED ORDER — DEXTROSE 50 % IV SOLN
25.0000 mL | Freq: Once | INTRAVENOUS | Status: AC
  Administered 2014-10-05: 25 mL via INTRAVENOUS

## 2014-10-05 NOTE — Progress Notes (Signed)
Went into pt room to confirm with pt family that they were ready for comfort care measures, pt Daughter was hesitant to answer that she was ready to proceed with comfort care. I asked all other family and visitors to leave the room except the pt daughter and parents. After allowing the daughter to discuss her feelings she does believe that comfort care/withdrawal of care is the best plan of care for her mother.   RN will continue to monitor and be at the bedside for the patient and family.

## 2014-10-05 NOTE — Progress Notes (Signed)
Per family's wishes pt was changed to comfort care and withdrawal of care was completed. Morphine drip was started at  Pt was extubated at [id number]-Jul-[id number] Pt cardiac time of death verified by myself and Theodoro Kos, RN was 07-26-10.

## 2014-10-05 NOTE — Progress Notes (Signed)
Patient came in via EMS being manually ventilated via BVM, ED physician intubated with a 7.5 ETT taped at 23 @ lips, good color change on ETCO2 detector, good BBS SATS 100%, chest X-Ray confirmed ETT placement, placed on above vent settings MD aware, transported on vent to CT scanner #1, for head CT after intubations vital signs stable during transport and back, will continue to monitor patient.

## 2014-10-05 NOTE — Code Documentation (Signed)
Bag with white colored substance found in pt bra along with a condom. [employer] notified.

## 2014-10-05 NOTE — Progress Notes (Signed)
   28-Oct-2014 1200  Clinical Encounter Type  Visited With Patient;Family;Patient and family together;Health care provider  Visit Type Initial;Spiritual support;Social support;Critical Care;ED;Patient actively dying  Spiritual Encounters  Spiritual Needs Emotional;Grief support  Stress Factors  Family Stress Factors Family relationships;Health changes;Loss;Loss of control;Major life changes   Chaplain was paged at 11:04 AM to patient's room in the ED. Chaplain was made aware earlier in the morning that the patient had been admitted and was unresponsive due to a stroke. Chaplain introduced himself to the patient's mother and one of the patient's sisters and was present while they were updated on the patient's condition by the ED physician and neurologist. Patient's mother and sister were devastated and explained that they did not get to see the patient much because she kept herself estranged from them and was using drugs. Patient's other sisters later arrived to the hospital alongside more family members. Patient's family continues to wrestle with the likelihood that the patient will not survive this incident. Chaplain provided grief support throughout  this time. Patient's boyfriend was with the patient when she went unresponsive and the family has consented to let him visit with the patient. Chaplain escorted patient's family to 56M waiting area and supported them while they were able to be at bedside for the first time. Patient also has a daughter who is present. Family continues to grieve at bedside. Chaplain let patient's mother know he is available throughout the day for further support. Page Merrilyn Puma chaplain if further support needed.  Cranston Neighbor, Chaplain  12:41 PM

## 2014-10-05 NOTE — Code Documentation (Signed)
Pt arrival to ED 

## 2014-10-05 NOTE — Progress Notes (Signed)
SLP Cancellation Note  Patient Details Name: Norma Little MRN: [id number] DOB: [birth year]   Cancelled treatment:       Reason Eval/Treat Not Completed: Patient's level of consciousness;Patient not medically ready;Fatigue/lethargy limiting ability to participate;Medical issues which prohibited therapy. ST to sign off, pt with poor prognosis. Reconsult pending change in medical status  Marcene Duos MA, CCC-SLP Acute Care Speech Language Pathologist    Norma Little 11/02/14, 1:18 PM

## 2014-10-05 NOTE — Procedures (Signed)
Extubation Procedure Note  Patient Details:   Name: Stormie Ventola DOB: Dec 04, 1975 MRN: [id number]   Airway Documentation:  Airway 7.5 mm (Active)  Secured at (cm) 23 cm 10/23/14  7:51 PM  Measured From Lips 2014/10/23  7:51 PM  Secured Location Center 2014-10-23  7:51 PM  Secured By [employer] 10-23-2014  7:51 PM  Tube Holder Repositioned Yes 10-23-2014  7:51 PM  Cuff Pressure (cm H2O) 28 cm H2O 10/23/2014  8:08 AM  Site Condition Dry 10/23/14  7:51 PM    Evaluation  O2 sats: currently acceptable Complications: No apparent complications Patient did not tolerate procedure well. Bilateral Breath Sounds: Clear, Diminished Suctioning: Airway No   Pt terminally extubated per MD order.  Augustine Radar October 23, 2014, 11:10 PM

## 2014-10-05 NOTE — Progress Notes (Signed)
Utilization Review competed on this pt.

## 2014-10-05 NOTE — Code Documentation (Signed)
Dr Madilyn Hook attempted to intubate pt, unsuccessful. Pt vomiting and being suctioned to clear airway by Dr Madilyn Hook and Respiratory.  Pt Sats began to drop as low as 54%. Dr Patria Mane called to come to bedside to assist.

## 2014-10-05 NOTE — Progress Notes (Signed)
Discussed current situation with patients mother and family. Counseled them on the overall poor prognosis based on her exam and imaging. Discussed the poor prognosis for a meaningful neurological recovery. They expressed understanding. Discussed code status and goals of care with family. At this time will make her DNR and will not plan for any aggressive measures. Family will discuss further goals of care.   [place] donor network has been made aware. Discussed case with CCM, Dr Vassie Loll who will assist with blood pressure control at this time. Will continue to monitor.   Elspeth Cho, DO [facility] [phone]  If 7pm- 7am, please page neurology on call as listed in AMION.

## 2014-10-05 NOTE — Progress Notes (Signed)
Spoke with Dr. Thad Ranger, she will place comfort care orders and orders to withdrawal when family is ready.

## 2014-10-05 NOTE — H&P (Addendum)
Stroke Consult    Chief Complaint: altered mental status  HPI: Norma Little is an 39 y.o. female hx of polysubstance abuse presenting after being found unresponsive. Unclear LSW. Per EMS, she was noted to have back pain, took an aleve and then shortly later was found unresponsive in the bathroom. EMS gave narcan x 2 with no response. Patient intubated for airway protection, concern for aspiration during procedure. During initial ED evaluation, a small bag containing a crushed white substance was found in the patients bra. Further history unable to be obtained due to lack of family members present.   CT head imaging reviewed, shows a large pontine ICH. UDS + for cocaine. BP markedly elevated upon ED arrival. Discussed case with neurosurgery, Dr Jule Ser. No surgical intervention at this time based on lack of hydrocephalus on exam. Location of bleed is not amendable to surgical intervention. Will monitor closely.   Date last known well: unclear Time last known well: unclear tPA Given: no, ICH Modified Rankin: Rankin Score=0  ICH score of 5  No past medical history on file.  No past surgical history on file.  No family history on file. Social History:  has no tobacco, alcohol, and drug history on file.  Allergies: Allergies not on file   (Not in a hospital admission)  ROS: Out of a complete 14 system review, the patient complains of only the following symptoms, and all other reviewed systems are negative. Unable to obtain  Physical Examination: Filed Vitals:   November 04, 2014 0950  BP: 177/99  Pulse: 135  Temp: 102.9 F (39.4 C)  Resp: 30   Physical Exam  Constitutional: He appears well-developed and well-nourished.  Psych: Affect appropriate to situation Eyes: No scleral injection HENT: No OP obstrucion Head: Normocephalic.  Cardiovascular: Normal rate and regular rhythm.  Respiratory: Effort normal and breath sounds normal.  GI: Soft. Bowel sounds are normal. No  distension. There is no tenderness.  Skin: WDI  Neurologic Examination: Mental Status: Intubated, off sedation, nonverbal, not following commands Cranial Nerves: II: pupils 5mm bilateral, sluggish response III,IV, VI: ptosis not present, eyes midline, + dolls eye reflex V,VII: negative corneal reflex VIII: unable to obtain IX,X: weak cough present Motor: No spontaneous movement of extremities Question triple flexion bilateral LE with noxious stimuli Sensory: no withdrawal to noxious stimuli Deep Tendon Reflexes: 2+ and symmetric throughout Plantars: Right: mute   Left: mute Cerebellar: Unable to obtain Gait: unable to obtain  Laboratory Studies:   Basic Metabolic Panel:  Recent Labs Lab 11-04-14 0805 11/04/14 0824  NA 136 138  K 3.3* 3.3*  CL 99* 103  CO2 24  --   GLUCOSE 268* 259*  BUN 16 24*  CREATININE 1.45* 1.30*  CALCIUM 9.2  --     Liver Function Tests:  Recent Labs Lab 04-Nov-2014 0805  AST 77*  ALT 40  ALKPHOS 87  BILITOT 0.4  PROT 7.6  ALBUMIN 3.3*   No results for input(s): LIPASE, AMYLASE in the last 168 hours. No results for input(s): AMMONIA in the last 168 hours.  CBC:  Recent Labs Lab 2014-11-04 0805 11-04-14 0824  WBC 17.8*  --   NEUTROABS 7.5  --   HGB 14.5 18.0*  HCT 46.1* 53.0*  MCV 86.8  --   PLT 311  --     Cardiac Enzymes: No results for input(s): CKTOTAL, CKMB, CKMBINDEX, TROPONINI in the last 168 hours.  BNP: Invalid input(s): POCBNP  CBG: No results for input(s): GLUCAP in the last 168 hours.  Microbiology: No results found for this or any previous visit.  Coagulation Studies:  Recent Labs  October 09, 2014 0805  LABPROT 15.3*  INR 1.19    Urinalysis: No results for input(s): COLORURINE, LABSPEC, PHURINE, GLUCOSEU, HGBUR, BILIRUBINUR, KETONESUR, PROTEINUR, UROBILINOGEN, NITRITE, LEUKOCYTESUR in the last 168 hours.  Invalid input(s): APPERANCEUR  Lipid Panel:  No results found for: CHOL, TRIG, HDL, CHOLHDL,  VLDL, LDLCALC  HgbA1C: No results found for: HGBA1C  Urine Drug Screen:  No results found for: LABOPIA, COCAINSCRNUR, LABBENZ, AMPHETMU, THCU, LABBARB  Alcohol Level:  Recent Labs Lab 10/09/14 0807  ETH <5    Other results:  Imaging: Dg Chest Port 1 View  2014/10/09   CLINICAL DATA:  Unresponsive patient.  EXAM: PORTABLE CHEST - 1 VIEW  COMPARISON:  No priors.  FINDINGS: Patient is intubated, with the tip of the endotracheal tube at the level of the carina. Near complete opacification of the right hemithorax. Generalized haziness in the left lung. No cephalization of the pulmonary vasculature. Heart size is mildly enlarged. Mediastinal contours are distorted by patient positioning.  IMPRESSION: 1. Intubated patient with tip of the endotracheal tube at the level of the carina. This should be withdrawn approximately 4-5 cm for more optimal placement. 2. Mild haziness in the left lung, which could reflect noncardiogenic pulmonary edema. 3. Mild cardiomegaly. These results were called by telephone at the time of interpretation on 10-09-2014 at 8:41 am to Dr. Tilden Fossa, who verbally acknowledged these results.   Electronically Signed   By: Trudie Reed M.D.   On: October 09, 2014 08:48    Assessment: 39 y.o. 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 Differential includes hypertensive bleed vs drug related (cocaine +). Cannot rule out underlying vascular malformation. Based on exam and extent of ICH long term prognosis for meaningful recovery is poor. Discussed with neurosurgery, no indication for emergent ventriculostomy placement at this time due to lack of hydrocephalus. Bleed location is not amendable to surgical evacuation.   Patients mother (is local in [place]) was notified, she has not seen the patient in years. Patient has a 62 year daughter who lives with her grandmother, she also has not seen her mother in years.  Patients mother states family will be coming in.    Plan: 1) Admit to ICU 2) no antiplatelets or anticoagulants 3) blood pressure control with goal systolic <160 4) Frequent neuro checks 5) Will need to discuss goals of care with family. Poor prognosis for meaningful recovery 6) Appreciate CCM input for vent management   This patient is critically ill and at significant risk of neurological worsening, death and care requires constant monitoring of vital signs, hemodynamics,respiratory and cardiac monitoring,review of multiple databases, neurological assessment, discussion with family, other specialists and medical decision making of high complexity. I spent 80 minutes of neurocritical care time in the care of this patient.    Elspeth Cho, DO [facility] [phone]  If 7pm- 7am, please page neurology on call as listed in AMION. 2014-10-09, 9:54 AM

## 2014-10-05 NOTE — ED Notes (Addendum)
Pt had a small bag of white crushed substance. Given to [employer]

## 2014-10-05 NOTE — Code Documentation (Signed)
To CT

## 2014-10-05 NOTE — Progress Notes (Signed)
Nutrition Brief Note  Chart reviewed.39 y.o. female hx of substance abuse (+cocaine)  and has large brainstem infarct per CT. She was found unresponsive and has been intubated to protect airway. Pt has poor prognosis. Family does not wish for aggressive measures.  No further nutrition interventions warranted at this time.  Please re-consult as needed.   Royann Shivers MS,RD,CSG,LDN Office: [phone] Pager: [phone]

## 2014-10-05 NOTE — Progress Notes (Signed)
Futures trader, spoke with [name]. Pt is an ME case and all lines.tubes should be left in place. He plans to examine her tomorrow after lunch.  Norma Little

## 2014-10-05 NOTE — Progress Notes (Signed)
Chaplain responded to page.  Met with RN in unit then family at bedside.  Several family members actively crying at bedside, grieving appropriately.  Pt's younger children also in room, not crying, chaplain will continue to be available for them if their emotional state changes.  RN informed chaplain that family wishes to convert to comfort care now instead of tomorrow.  Currently waiting for orders, will remain available to family for that time.  Chaplain provided emotional and spiritual support through presence, empathetic listening and prayer.      10/30/2014 2100  Clinical Encounter Type  Visited With Patient and family together  Visit Type Initial;Spiritual support;Critical Care;Patient actively dying  Spiritual Encounters  Spiritual Needs Prayer;Emotional;Grief support  Stress Factors  Family Stress Factors Family relationships;Loss   Geralyn Flash 10/09/2014 9:14 PM

## 2014-10-05 NOTE — ED Notes (Signed)
Received pt from a residence by EMS with c/o initial call came out as back pain. Per bystanders on scene, pt c/o back pain went to bathroom and took an aleve. Next time bystanders seen pt unresponsive  Bystanders only knew pt name. Pt given 4 mg of Narcan by EMS without change. EMS unable to intubate pt PTA.

## 2014-10-05 NOTE — ED Notes (Signed)
Patient transported to CT with RN transport.

## 2014-10-05 NOTE — Consult Note (Signed)
Name: Norma Little MRN: [id number] DOB: April 27, 1975    ADMISSION DATE:  October 21, 2014 CONSULTATION DATE:  8/6  REFERRING MD :  Hosie Poisson (neuro)   CHIEF COMPLAINT:  Vent management, catastrophic ICH  BRIEF PATIENT DESCRIPTION: 39yo female with hx HTN, polysubstance abuse found unresponsive in the bathroom with unknown downtime. BP markedly elevated in ER. Intubated in ER for airway protection, GCS=3.  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  UDS + for cocaine.  PCCM consulted for vent management.   SIGNIFICANT EVENTS    STUDIES:  CT head 8/6>>   HISTORY OF PRESENT ILLNESS:  39yo female with hx HTN, polysubstance abuse found unresponsive in the bathroom with unknown downtime. BP markedly elevated in ER. Intubated in ER for airway protection, GCS=3.  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  UDS + for cocaine.  Plastic bag with white powdery substance found on her person in ER.  PCCM consulted for vent management.   PAST MEDICAL HISTORY :   has a past medical history of HTN (hypertension) and Polysubstance abuse.  has no past surgical history on file. Prior to Admission medications   Not on File   Allergies  Allergen Reactions  . Penicillins     FAMILY HISTORY:  family history is not on file. SOCIAL HISTORY:    REVIEW OF SYSTEMS:   Unable.   SUBJECTIVE:   VITAL SIGNS: Temp:  [100.6 F (38.1 C)-108.3 F (42.4 C)] 108.3 F (42.4 C) (08/06 1135) Pulse Rate:  [97-166] 159 (08/06 1135) Resp:  [12-33] 27 (08/06 1135) BP: (87-231)/(40-210) 87/40 mmHg (08/06 1135) SpO2:  [48 %-100 %] 100 % (08/06 1135) FiO2 (%):  [100 %] 100 % (08/06 1127) Weight:  [224 lb 13.9 oz (102 kg)] 224 lb 13.9 oz (102 kg) (08/06 1127)  PHYSICAL EXAMINATION: General:  Young female, NAD on vent  Neuro:  No response, pupils 5mm, sluggish, does not breathe over vent rate, weak gag  HEENT:  Mm moist, ETT Cardiovascular:  s1s2 tachy Lungs:  resps even non labored on vent, few scattered rhonchi    Abdomen:  Soft, -bs  Musculoskeletal:  Warm and dry, no edema     Recent Labs Lab 2014/10/21 0805 10-21-2014 0824  NA 136 138  K 3.3* 3.3*  CL 99* 103  CO2 24  --   BUN 16 24*  CREATININE 1.45* 1.30*  GLUCOSE 268* 259*    Recent Labs Lab 2014/10/21 0805 Oct 21, 2014 0824  HGB 14.5 18.0*  HCT 46.1* 53.0*  WBC 17.8*  --   PLT 311  --    Dg Chest Port 1 View  10-21-2014   CLINICAL DATA:  Unresponsive patient.  EXAM: PORTABLE CHEST - 1 VIEW  COMPARISON:  No priors.  FINDINGS: Patient is intubated, with the tip of the endotracheal tube at the level of the carina. Near complete opacification of the right hemithorax. Generalized haziness in the left lung. No cephalization of the pulmonary vasculature. Heart size is mildly enlarged. Mediastinal contours are distorted by patient positioning.  IMPRESSION: 1. Intubated patient with tip of the endotracheal tube at the level of the carina. This should be withdrawn approximately 4-5 cm for more optimal placement. 2. Mild haziness in the left lung, which could reflect noncardiogenic pulmonary edema. 3. Mild cardiomegaly. These results were called by telephone at the time of interpretation on October 21, 2014 at 8:41 am to Dr. Tilden Fossa, who verbally acknowledged these results.   Electronically Signed   By: Trudie Reed M.D.   On:  Oct 29, 2014 08:48    ASSESSMENT / PLAN:  PULMONARY OETT 8/6>>> Acute respiratory failure - r/t catastrophic ICH  P:   Vent support - 8cc/kg  F/u CXR  F/u ABG   CARDIOVASCULAR HTN  Cocaine abuse  P:  cardene gtt PRN - goal SBP <160 F/u troponin  PRN labetolol - avoid if able with hx cocaine   RENAL AKI v CKD - unknown baseline  Hypokalemia - mild  P:   F/u chem  Replete K as needed   GASTROINTESTINAL No active issue  P:   NPO for now  PPI   HEMATOLOGIC Leukocytosis  ICH P:  See ID  SCD's   INFECTIOUS No active issue  P:   BCx2 8/6 UC 8/6  Monitor fever, wbc curve off abx    ENDOCRINE Hyperglycemia - no known hx DM    P:   SSI   NEUROLOGIC ICH - pontine.  No neurosurgical intervention at this time. Location of bleed not amenable to surgical intervention.  Polysubstance abuse - cocaine + P:   RASS goal: 0 Per neuro   Prognosis very poor.  Neuro to discuss goals of care with family when they arrive.    FAMILY  - Updates:  No family available 8/6.  Per neuro and EDP, most family (including pts mother and adult daughter) are estranged.  A sister is said to be en route.      Dirk Dress, NP 2014-10-29  11:41 AM Pager: [phone] or [phone]

## 2014-10-05 NOTE — Progress Notes (Signed)
Pt family approached another RN on the unit and stated that they would like to proceed with withdrawal of care. Victorino Dike, RN paged the chaplain, I will make phone call to primary MD to see how to proceed at this point.

## 2014-10-05 NOTE — Code Documentation (Signed)
Dr Madilyn Hook able to successfully intubate pt.

## 2014-10-05 NOTE — Progress Notes (Signed)
Increased RR to 30 due to ABG results MD agreed.

## 2014-10-05 NOTE — ED Provider Notes (Signed)
CSN: [id number]     Arrival date & time 02-Nov-2014  0759 History   First MD Initiated Contact with Patient 02-Nov-2014 [phone]     No chief complaint on file.    The history is provided by the EMS personnel. No language interpreter was used.   Ms. Moehring presents for being unresponsive.  Level V caveat due to unresponsiveness.  EMS received report from her boyfriend.  She was noted to have back pain, took an aleve and was found unresponsive in the bathroom.  Prior to ED arrival EMS gave IM and IV narcan x 2 with no response.  The attempted intubation once, unable to intubate.  Nasal and oral airways were placed and BVM ventilations were initiated.     No past medical history on file. No past surgical history on file. No family history on file. History  Substance Use Topics  . Smoking status: Not on file  . Smokeless tobacco: Not on file  . Alcohol Use: Not on file   OB History    No data available     Review of Systems  Unable to perform ROS     Allergies  Review of patient's allergies indicates not on file.  Home Medications   Prior to Admission medications   Not on File   BP 197/127 mmHg  Pulse 122  Resp 20  SpO2 100%  LMP  (LMP Unknown) Physical Exam  Constitutional: She appears distressed.  HENT:  Head: Atraumatic.  Nasal trumpet in place  Eyes:  Pupils fixed and dilated  Cardiovascular: Regular rhythm.   Tachycardic, no murmur  Pulmonary/Chest:  Agonal respirations.  Crackles bilaterally with BVM ventilation  Abdominal: Soft. There is no tenderness. There is no rebound and no guarding.  Musculoskeletal: She exhibits no edema or tenderness.  Neurological:  GCS 1-1-1  Skin: Skin is warm and dry.  Psychiatric:  Unable to assess  Nursing note and vitals reviewed.   ED Course  Procedures (including critical care time) INTUBATION Performed by: Tilden Fossa  Required items: required blood products, implants, devices, and special equipment available Patient  identity confirmed: provided demographic data and hospital-assigned identification number Time out: Immediately prior to procedure a "time out" was called to verify the correct patient, procedure, equipment, support staff and site/side marked as required.  Indications: respiratory failure, airway protection  Intubation method: Glidescope Laryngoscopy   Preoxygenation: BVM  Sedatives: Etomidate Paralytic: Succinylcholine  Tube Size: 7.5 cuffed  Initial attempt without medication, pt did vomit and attempt discontinued, bagging resumed after suctioning and paralytics were given.  Good view on second attempt, there was some edema of arytenoids and posterior OP.    Post-procedure assessment: chest rise and ETCO2 monitor Breath sounds: equal and absent over the epigastrium Tube secured with: ETT holder Chest x-ray interpreted by radiologist and me.  Chest x-ray findings: recommend ETT retraction  Patient tolerated the procedure well with no immediate complications.  CRITICAL CARE Performed by: Tilden Fossa   Total critical care time: 45 minutes  Critical care time was exclusive of separately billable procedures and treating other patients.  Critical care was necessary to treat or prevent imminent or life-threatening deterioration.  Critical care was time spent personally by me on the following activities: development of treatment plan with patient and/or surrogate as well as nursing, discussions with consultants, evaluation of patient's response to treatment, examination of patient, obtaining history from patient or surrogate, ordering and performing treatments and interventions, ordering and review of laboratory studies, ordering and review  of radiographic studies, pulse oximetry and re-evaluation of patient's condition.   Labs Review Labs Reviewed  COMPREHENSIVE METABOLIC PANEL - Abnormal; Notable for the following:    Potassium 3.3 (*)    Chloride 99 (*)    Glucose, Bld 268  (*)    Creatinine, Ser 1.45 (*)    Albumin 3.3 (*)    AST 77 (*)    GFR calc non Af Amer 44 (*)    GFR calc Af Amer 51 (*)    All other components within normal limits  URINALYSIS, ROUTINE W REFLEX MICROSCOPIC (NOT AT [facility]) - Abnormal; Notable for the following:    APPearance CLOUDY (*)    Glucose, UA 250 (*)    Hgb urine dipstick MODERATE (*)    Protein, ur >300 (*)    Leukocytes, UA TRACE (*)    All other components within normal limits  URINE RAPID DRUG SCREEN, HOSP PERFORMED - Abnormal; Notable for the following:    Cocaine POSITIVE (*)    All other components within normal limits  CBC WITH DIFFERENTIAL/PLATELET - Abnormal; Notable for the following:    WBC 17.8 (*)    RBC 5.31 (*)    HCT 46.1 (*)    RDW 16.0 (*)    Neutrophils Relative % 42 (*)    Lymphocytes Relative 50 (*)    Lymphs Abs 8.9 (*)    Monocytes Absolute 1.2 (*)    All other components within normal limits  PROTIME-INR - Abnormal; Notable for the following:    Prothrombin Time 15.3 (*)    All other components within normal limits  URINE MICROSCOPIC-ADD ON - Abnormal; Notable for the following:    Squamous Epithelial / LPF MANY (*)    Bacteria, UA MANY (*)    Casts HYALINE CASTS (*)    All other components within normal limits  GLUCOSE, CAPILLARY - Abnormal; Notable for the following:    Glucose-Capillary 200 (*)    All other components within normal limits  GLUCOSE, CAPILLARY - Abnormal; Notable for the following:    Glucose-Capillary 112 (*)    All other components within normal limits  GLUCOSE, CAPILLARY - Abnormal; Notable for the following:    Glucose-Capillary 110 (*)    All other components within normal limits  I-STAT CHEM 8, ED - Abnormal; Notable for the following:    Potassium 3.3 (*)    BUN 24 (*)    Creatinine, Ser 1.30 (*)    Glucose, Bld 259 (*)    Calcium, Ion 1.11 (*)    Hemoglobin 18.0 (*)    HCT 53.0 (*)    All other components within normal limits  I-STAT CG4 LACTIC ACID, ED -  Abnormal; Notable for the following:    Lactic Acid, Venous 7.06 (*)    All other components within normal limits  I-STAT ARTERIAL BLOOD GAS, ED - Abnormal; Notable for the following:    pH, Arterial 7.159 (*)    pCO2 arterial 77.4 (*)    pO2, Arterial 119.0 (*)    Bicarbonate 27.5 (*)    Acid-base deficit 4.0 (*)    All other components within normal limits  MRSA PCR SCREENING  URINE CULTURE  CULTURE, BLOOD (ROUTINE X 2)  CULTURE, BLOOD (ROUTINE X 2)  ETHANOL  GLUCOSE, CAPILLARY  I-STAT BETA HCG BLOOD, ED ([facility], [facility], [facility] ONLY)  I-STAT TROPOININ, ED    Imaging Review Dg Chest Port 1 View  10/12/14   CLINICAL DATA:  Unresponsive patient.  EXAM: PORTABLE  CHEST - 1 VIEW  COMPARISON:  No priors.  FINDINGS: Patient is intubated, with the tip of the endotracheal tube at the level of the carina. Near complete opacification of the right hemithorax. Generalized haziness in the left lung. No cephalization of the pulmonary vasculature. Heart size is mildly enlarged. Mediastinal contours are distorted by patient positioning.  IMPRESSION: 1. Intubated patient with tip of the endotracheal tube at the level of the carina. This should be withdrawn approximately 4-5 cm for more optimal placement. 2. Mild haziness in the left lung, which could reflect noncardiogenic pulmonary edema. 3. Mild cardiomegaly. These results were called by telephone at the time of interpretation on October 30, 2014 at 8:41 am to Dr. Tilden Fossa, who verbally acknowledged these results.   Electronically Signed   By: Trudie Reed M.D.   On: October 30, 2014 08:48     EKG Interpretation   Date/Time:  Saturday 30-Oct-2014 08:04:15 EDT Ventricular Rate:  113 PR Interval:  163 QRS Duration: 110 QT Interval:  360 QTC Calculation: 494 R Axis:   110 Text Interpretation:  Sinus tachycardia Biatrial enlargement Probable  anterior infarct, age indeterminate Lateral leads are also involved  artifact present Confirmed by Lincoln Brigham [phone]) on  10-30-2014 10:46:06 AM      MDM   Final diagnoses:  Acute intracerebral hemorrhage    Patient here after being found unresponsive. On initial ED arrival patient unresponsive with GCS of 3, pupils fixed and dilated. Patient intubated her notes for airway protection and respiratory support because she was apneic. CT demonstrates large pontine hemorrhage. Discussed with Dr. Newell Coral with neurosurgery who reviewed the images. He states that there is nothing surgical to do at this time. Discussed with Dr. Hosie Poisson with neurology who will evaluate the patient and admit. There was initial difficulty being able to contact family, but after about an hour and a half following ED arrival family was able to be reached and the patient's mother was notified of her critical condition. Family did arrive to the ED and they were updated on the patient's status. ABG obtained after patient was on the ventilator demonstrated hypercarbic acidosis, ventilator settings were increased from respirations of 20-28. Patient did develop hyperthermia in the department, likely due to thalamic injury. Blankets were removed and Tylenol was administered. Patient was started on a Cardene drip for hypertension, this was discontinued after patient's blood pressures decreased. Patient did have some shivering in the department, Ativan was given for this.    Tilden Fossa, MD 10/06/14 [phone]

## 2014-10-05 NOTE — Progress Notes (Signed)
Lily Peer spoke with Bradly Bienenstock with [facility] to report cardiac time of death, pt has been fully released from [facility] and is not a suitable donor.  I called pt mother, Carlton Adam at [phone] (home) to let her know that Ms. Diez had passed. She has the patient placement card and our unit phone number for when she figures out arrangement information. She said she will call us back tomorrow with information.

## 2014-10-05 NOTE — ED Notes (Signed)
Neurology MD at bedside

## 2014-10-06 NOTE — Progress Notes (Signed)
Post mortem checklist has been filled out as with all known information, pt has been transported to [facility]. Bed control has been notified.

## 2014-10-07 ENCOUNTER — Encounter (HOSPITAL_COMMUNITY): Payer: Self-pay | Admitting: *Deleted

## 2014-10-07 LAB — URINE CULTURE: Culture: >=100000

## 2014-10-07 NOTE — Discharge Summary (Signed)
Patient ID: Norma Little MRN: [id number] DOB/AGE: 40-Oct-1977 40 y.o.  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 female hx of polysubstance abuse presenting after being found unresponsive. Unclear LSW. Per EMS, she was noted to have back pain, took an aleve and then shortly later was found unresponsive in the bathroom. EMS gave narcan x 2 with no response. Patient intubated for airway protection, concern for aspiration during procedure. During initial ED evaluation, a small bag containing a crushed white substance was found in the patients bra. Further history unable to be obtained due to lack of family members present.  CT head imaging reviewed, shows a large pontine ICH. UDS + for cocaine. BP markedly elevated upon ED arrival. Discussed case with neurosurgery, Dr Jule Ser. No surgical intervention at this time based on lack of hydrocephalus on exam. Location of bleed is not amendable to surgical intervention. Will monitor closely.  Date last known well: unclear Time last known well: unclear tPA Given: no, ICH Modified Rankin: Rankin Score=0  ICH score of 5 Patient's medical condition is quite critical upon admission. She was emergently intubated but even without any sedation she was nonverbal and unresponsive with third pupils which were 5 mm and sluggishly responsive with absent corneal reflex and brainstem reflexes. Motor system exam shows only questionable triple flexion withdrawal in both lower extremities to painful stimuli. Patient was started on medications for controlling blood pressure and placed on  a ventilator. Her exam remained quite poor.Dr Hosie Poisson discussed current situation with patients mother and family. Counseled them on the overall poor prognosis based on her exam and imaging. Discussed the poor prognosis for a meaningful neurological recovery. They expressed understanding. Discussed code status and goals of care with family. The family decided to make her DNR and  not plan for any aggressive measures.  On 11-03-2014 at 8:58 PM the family approached the RN and stated they would like to proceed with withdrawal of care and ventilatory support was then gradually withdrawn  and [id number] and the patient passed away and was found to be pulseless and not breathing with cardiac time of death noted at 07/29/10. Patient was not found to be a candidate for organ donation. Patient was found to be a case for medical examiner's office. Signed: SETHI,PRAMOD 05-Nov-2014, 4:39 PM

## 2014-10-10 LAB — CULTURE, BLOOD (ROUTINE X 2)
Culture: NO GROWTH
Culture: NO GROWTH

## 2014-10-31 DEATH — deceased

## 2017-02-27 IMAGING — CT CT HEAD W/O CM
2 series · 16 of 30 positions shown, 18 images · non-contrast
Comparison: None.

CLINICAL DATA: Patient was complaining of back pain, and was
subsequently found unresponsive in the bathroom. Unknown past
medical history.

EXAM:
CT HEAD WITHOUT CONTRAST
TECHNIQUE: Contiguous axial images were obtained from the base of the skull
through the vertex without intravenous contrast.

[Series 201: head w/o, idose (1) · axial · non-contrast · 0.43mm/px · z∈[+53,+173]mm · 8 of 32 slices shown, 10 images]
[im 4/32  brain]
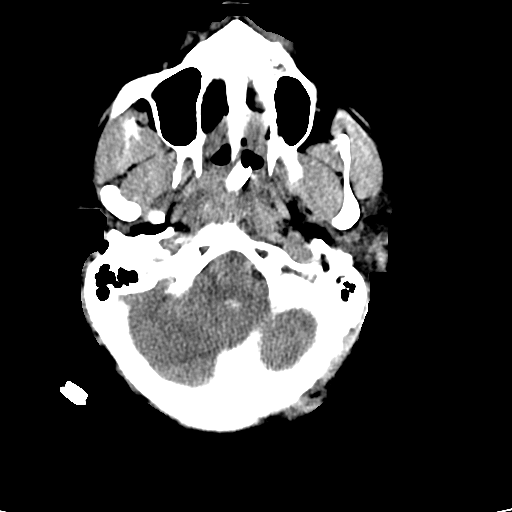
[im 4/32  bone]
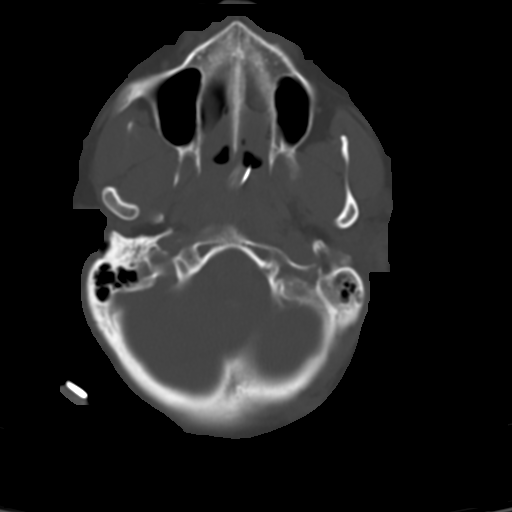
[im 7/32  brain]
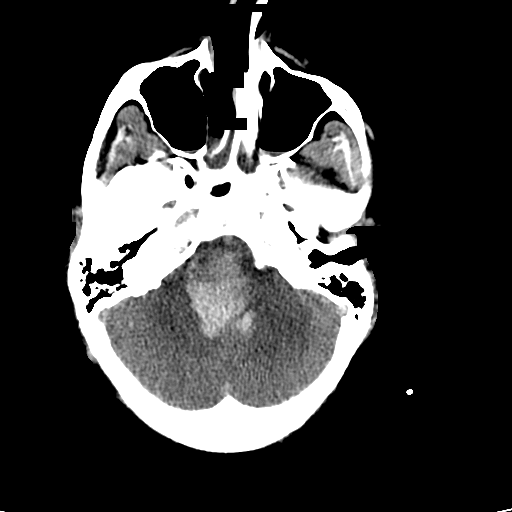
[im 11/32  brain]
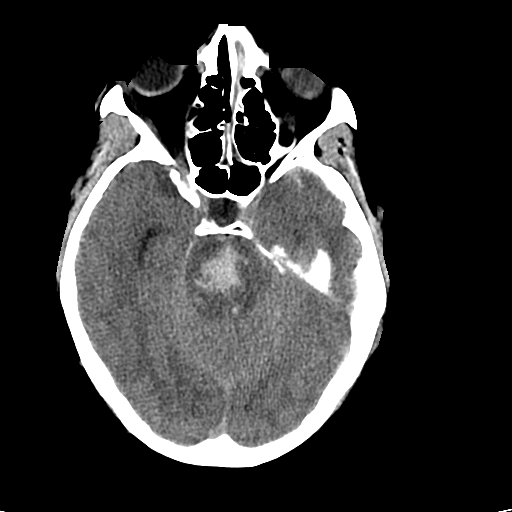
[im 14/32  brain]
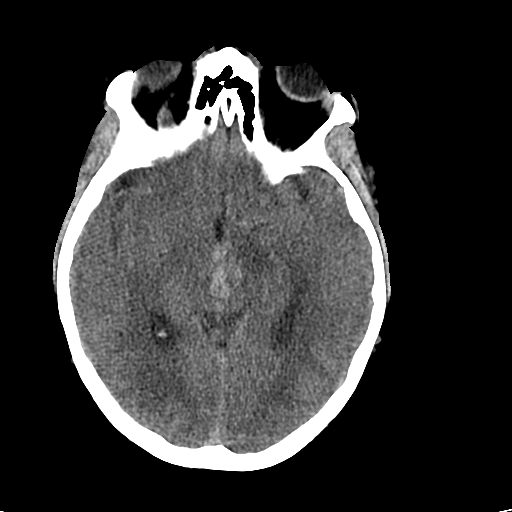
[im 18/32  brain]
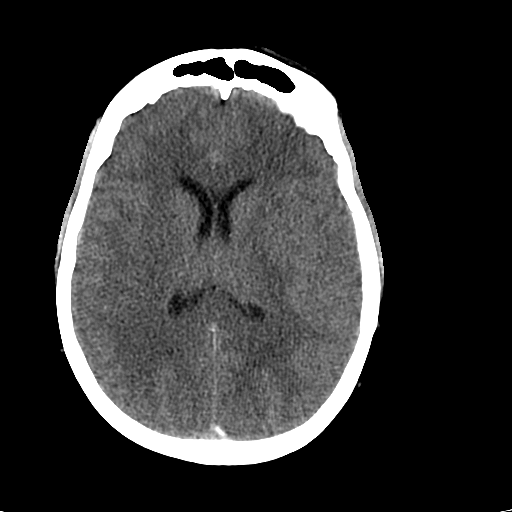
[im 18/32  bone]
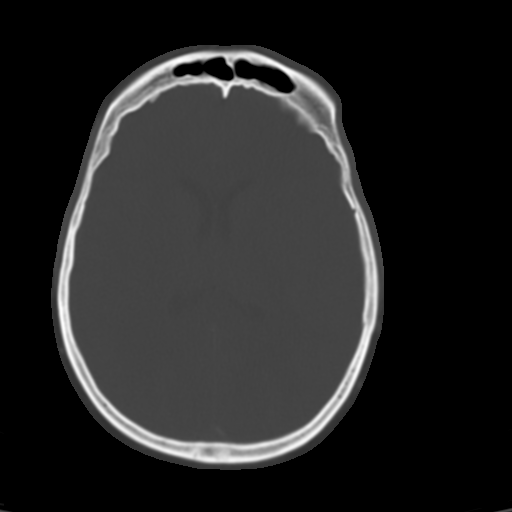
[im 21/32  brain]
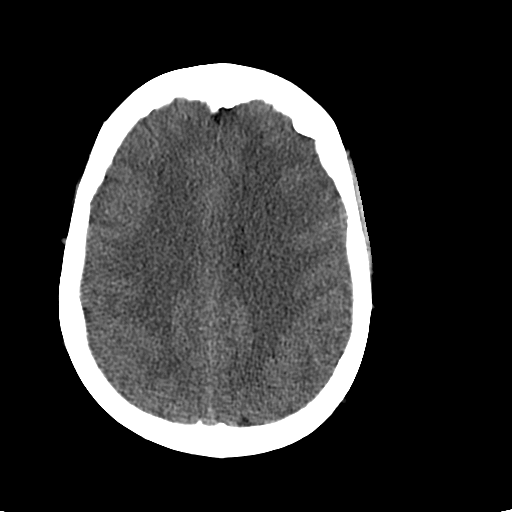
[im 25/32  brain]
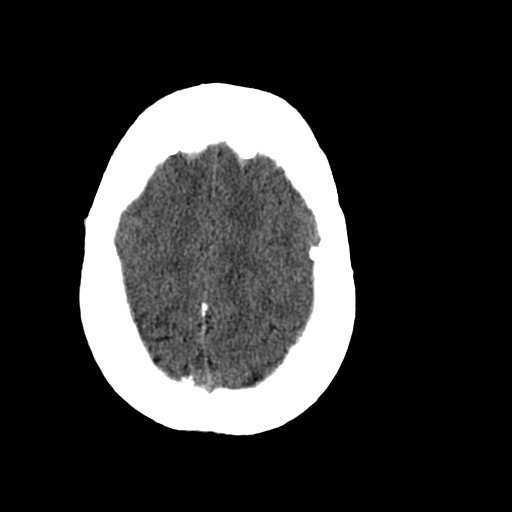
[im 28/32  brain]
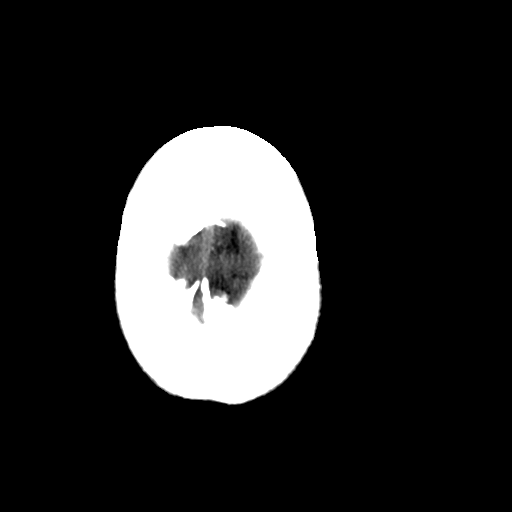

[Series 202: head w/o bone, idose (1) · axial · non-contrast · 0.43mm/px · z∈[+51,+176]mm · 8 of 64 slices shown]
[im 7/64  bone]
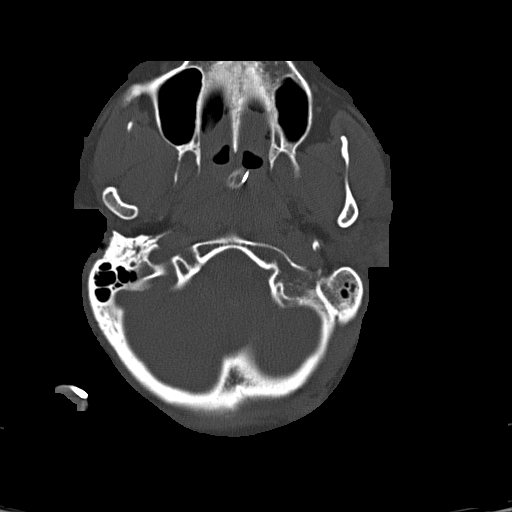
[im 14/64  bone]
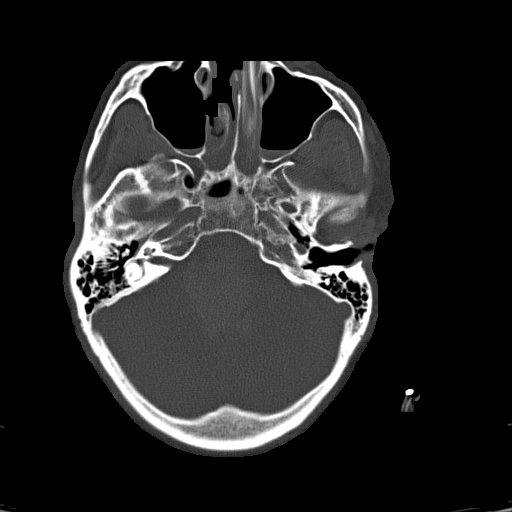
[im 20/64  bone]
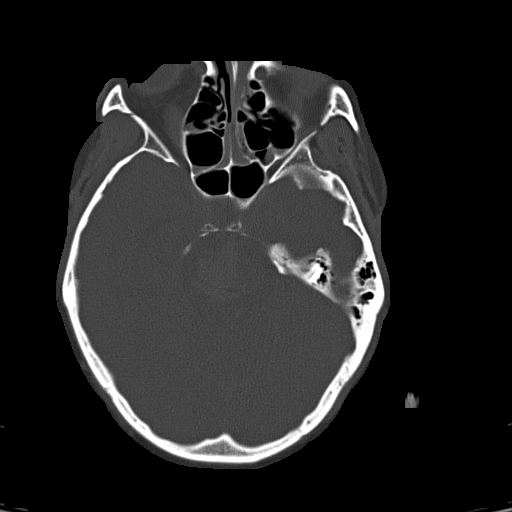
[im 27/64  bone]
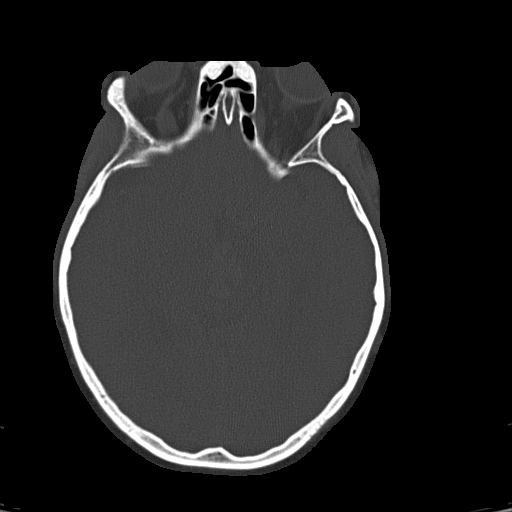
[im 37/64  bone]
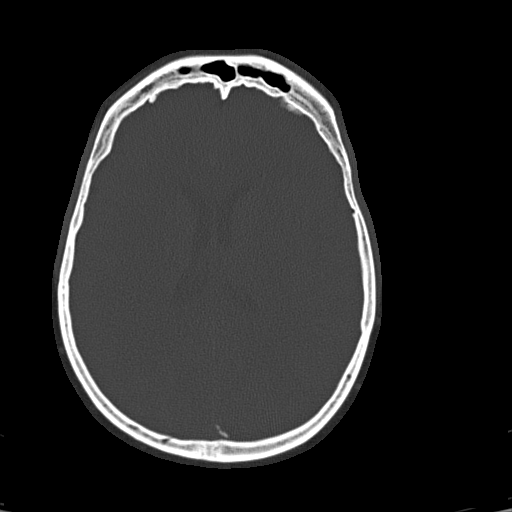
[im 44/64  bone]
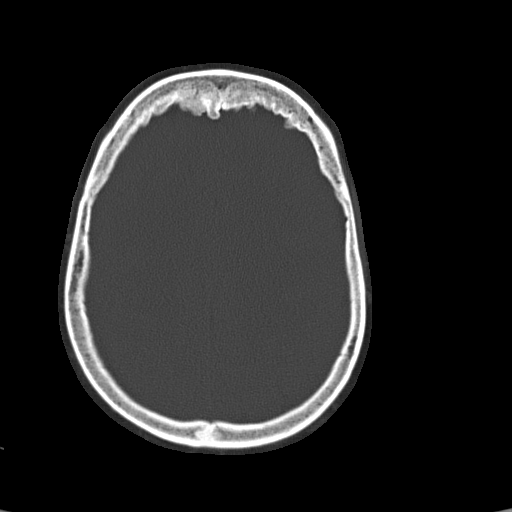
[im 50/64  bone]
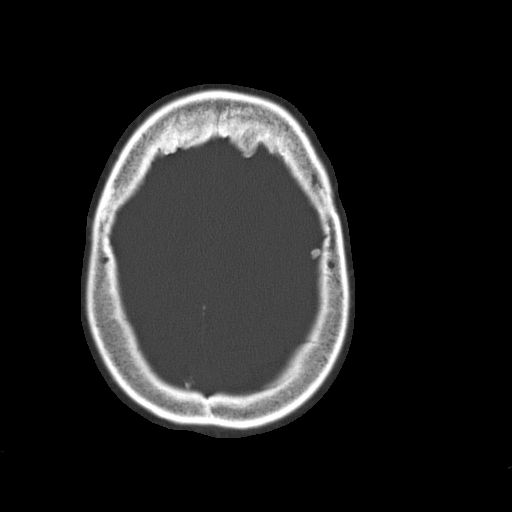
[im 57/64  bone]
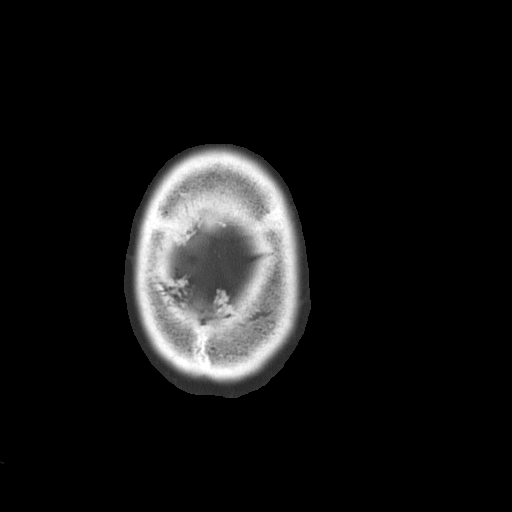

[16 of 30 positions shown; findings below may reference images not displayed]

FINDINGS: There is a large central pontine hemorrhage, 32 x 37 mm
cross-section as seen on image 8 extending into the fourth ventricle
and cephalad into the third ventricle. Significant edema of the
surrounding brainstem. Upward and downward cerebellar herniation is
developing. Obliteration of the basilar cisterns. Early
hydrocephalus with dilatation of the temporal horns. No parenchymal
hemorrhage or subdural/epidural collection.

The remainder of the brain is normal. Calvarium is intact. Slight
layering fluid in the sinuses is incidental. The patient is
intubated.

Considerations for etiology of the bleed would include drug related
(cocaine or crack), cavernoma or arteriovenous malformation,
hemorrhagic metastasis or severe over anticoagulation. Hypertensive
bleed is not favored.
IMPRESSION: 32 x 37 mm central pontine hemorrhage with early intraventricular
extension. Early obstructive hydrocephalus is developing. Severe
surrounding brainstem edema with obliteration of basilar cisterns.
See discussion above. Drug related hemorrhage is favored.

Critical Value/emergent results were called by telephone at the time
of interpretation on 10/05/2014 at [DATE] to the Physician Iin
[place], who verbally acknowledged these results.
